# Patient Record
Sex: Male | Born: 2000 | Race: White | Hispanic: No | Marital: Single | Smoking: Current some day smoker
Health system: Southern US, Community
[De-identification: ages and names within clinical notes are randomized; demographics above are authoritative.]

---
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
## 2023-07-26 ENCOUNTER — Encounter: Payer: Self-pay | Admitting: Emergency Medicine

## 2023-07-26 ENCOUNTER — Other Ambulatory Visit: Payer: Self-pay

## 2023-07-26 ENCOUNTER — Emergency Department: Payer: Self-pay

## 2023-07-26 DIAGNOSIS — S92351A Displaced fracture of fifth metatarsal bone, right foot, initial encounter for closed fracture: Secondary | ICD-10-CM | POA: Insufficient documentation

## 2023-07-26 DIAGNOSIS — W19XXXA Unspecified fall, initial encounter: Secondary | ICD-10-CM | POA: Insufficient documentation

## 2023-07-26 DIAGNOSIS — M7989 Other specified soft tissue disorders: Secondary | ICD-10-CM | POA: Insufficient documentation

## 2023-07-26 DIAGNOSIS — Y9367 Activity, basketball: Secondary | ICD-10-CM | POA: Insufficient documentation

## 2023-07-26 NOTE — ED Triage Notes (Signed)
 Pt presents to the ED via POV with complaints of R foot pain that started yesterday after playing basketball. Notable swelling to the lateral side of his foot. Cap refill <3 seconds. A&Ox4 at this time. Denies CP or SOB.

## 2023-07-27 ENCOUNTER — Emergency Department
Admission: EM | Admit: 2023-07-27 | Discharge: 2023-07-27 | Disposition: A | Discharge status: Home / Self Care | Payer: Self-pay | Attending: Emergency Medicine | Admitting: Emergency Medicine

## 2023-07-27 DIAGNOSIS — S92351A Displaced fracture of fifth metatarsal bone, right foot, initial encounter for closed fracture: Secondary | ICD-10-CM

## 2023-07-27 MED ORDER — OXYCODONE-ACETAMINOPHEN 5-325 MG PO TABS
1.0000 | ORAL_TABLET | Freq: Once | ORAL | Status: AC
  Administered 2023-07-27: 1 via ORAL
  Filled 2023-07-27: qty 1

## 2023-07-27 MED ORDER — KETOROLAC TROMETHAMINE 60 MG/2ML IM SOLN
30.0000 mg | Freq: Once | INTRAMUSCULAR | Status: AC
  Administered 2023-07-27: 30 mg via INTRAMUSCULAR
  Filled 2023-07-27: qty 2

## 2023-07-27 MED ORDER — OXYCODONE-ACETAMINOPHEN 5-325 MG PO TABS: 1.0000 | ORAL_TABLET | ORAL | 0 refills | Status: AC | PRN

## 2023-07-27 NOTE — Discharge Instructions (Addendum)
 Take ibuprofen as needed for pain; Percocet as needed for more severe pain. Wear boot and use crutches to help you walk.  Do not bear weight on your right foot.  Return to the ER for worsening symptoms, numbness/tingling or other concerns.

## 2023-07-27 NOTE — ED Provider Notes (Signed)
 [facility] Provider Note    None    (approximate)   History   Foot Injury   HPI  Jeffery Crawford is a 23 y.o. male who presents to the ED from home with his mother with a chief complaint of right foot pain and injury incurred 2 days ago.  Patient was playing basketball, landed on the outside of his right foot.  Presents with pain and swelling.  Voices no other complaints or injuries.     Past Medical History  History reviewed. No pertinent past medical history.   Active Problem List  There are no active problems to display for this patient.    Past Surgical History  History reviewed. No pertinent surgical history.   Home Medications   Prior to Admission medications   Not on File     Allergies  Patient has no known allergies.   Family History  History reviewed. No pertinent family history.   Physical Exam  Triage Vital Signs: ED Triage Vitals  Encounter Vitals Group     BP 07/26/23 2305 117/81     Systolic BP Percentile --      Diastolic BP Percentile --      Pulse Rate 07/26/23 2305 82     Resp 07/26/23 2305 18     Temp 07/26/23 2305 98.6 F (37 C)     Temp Source 07/26/23 2305 Oral     SpO2 07/26/23 2305 100 %     Weight 07/26/23 2305 160 lb (72.6 kg)     Height 07/26/23 2305 5\' 10"  (1.778 m)     Head Circumference --      Peak Flow --      Pain Score 07/26/23 2306 5     Pain Loc --      Pain Education --      Exclude from Growth Chart --     Updated Vital Signs: BP 117/81 (BP Location: Right Arm)   Pulse 82   Temp 98.6 F (37 C) (Oral)   Resp 18   Ht 5\' 10"  (1.778 m)   Wt 72.6 kg   SpO2 100%   BMI 22.96 kg/m    General: Awake, no distress.  CV:  Good peripheral perfusion.  Resp:  Normal effort.  Abd:  No distention.  Other:  Right foot: Uniform swelling.  Small scab to dorsal right fifth digit.  Point tender base of right metatarsal.  Decreased range of motion secondary to pain.  2+ distal pulses.  Brisk,  less than 5-second capillary refill.   ED Results / Procedures / Treatments  Labs (all labs ordered are listed, but only abnormal results are displayed) Labs Reviewed - No data to display   EKG  None   RADIOLOGY I have independently visualized and interpreted patient's imaging study as well as noted the radiology interpretation:  Right foot/ankle: Comminuted intra-articular fracture at the base of the fifth metatarsal with 4 mm of distraction  Official radiology report(s): DG Foot Complete Right Result Date: 07/26/2023 CLINICAL DATA:  Right foot and ankle pain, bruising and swelling, basketball injury EXAM: RIGHT ANKLE - COMPLETE 3+ VIEW; RIGHT FOOT COMPLETE - 3+ VIEW COMPARISON:  None Available. FINDINGS: Right ankle: Frontal, oblique, lateral views are obtained. No fracture, subluxation, or dislocation. Joint spaces are well preserved. Anterior and lateral soft tissue swelling. Right foot: Frontal, oblique, and lateral views of the right foot are obtained. There is a comminuted transverse intra-articular fracture at the base of the fifth  metatarsal, with 4 mm of distraction of the fracture fragments. No other acute fracture. Joint spaces are well preserved. Marked soft tissue swelling throughout the forefoot and midfoot. IMPRESSION: 1. Comminuted intra-articular fracture at the base of the fifth metatarsal, with 4 mm of distraction at the fracture site. 2. Soft tissue swelling throughout the midfoot, forefoot, and ankle. Electronically Signed   By: Bobbye Burrow M.D.   On: 07/26/2023 23:36   DG Ankle Complete Right Result Date: 07/26/2023 CLINICAL DATA:  Right foot and ankle pain, bruising and swelling, basketball injury EXAM: RIGHT ANKLE - COMPLETE 3+ VIEW; RIGHT FOOT COMPLETE - 3+ VIEW COMPARISON:  None Available. FINDINGS: Right ankle: Frontal, oblique, lateral views are obtained. No fracture, subluxation, or dislocation. Joint spaces are well preserved. Anterior and lateral soft tissue  swelling. Right foot: Frontal, oblique, and lateral views of the right foot are obtained. There is a comminuted transverse intra-articular fracture at the base of the fifth metatarsal, with 4 mm of distraction of the fracture fragments. No other acute fracture. Joint spaces are well preserved. Marked soft tissue swelling throughout the forefoot and midfoot. IMPRESSION: 1. Comminuted intra-articular fracture at the base of the fifth metatarsal, with 4 mm of distraction at the fracture site. 2. Soft tissue swelling throughout the midfoot, forefoot, and ankle. Electronically Signed   By: Bobbye Burrow M.D.   On: 07/26/2023 23:36     PROCEDURES:  Critical Care performed: No  Procedures   MEDICATIONS ORDERED IN ED: Medications  ketorolac (TORADOL) injection 30 mg (has no administration in time range)  oxyCODONE-acetaminophen (PERCOCET/ROXICET) 5-325 MG per tablet 1 tablet (has no administration in time range)     IMPRESSION / MDM / ASSESSMENT AND PLAN / ED COURSE  I reviewed the triage vital signs and the nursing notes.                             23 year old male presenting with right foot injury.  Right metatarsal fracture noted on x-ray.  Administer IM Ketorolac, oral Percocet.  Placed in cam boot, crutches, instructed on nonweightbearing and patient will follow-up closely with podiatry.  Strict return precautions given.  Patient and mother verbalized understanding and agree with plan of care.  Patient's presentation is most consistent with acute, uncomplicated illness.    FINAL CLINICAL IMPRESSION(S) / ED DIAGNOSES   Final diagnoses:  Closed displaced fracture of fifth metatarsal bone of right foot, initial encounter     Rx / DC Orders   ED Discharge Orders     None        Note:  This document was prepared using Dragon voice recognition software and may include unintentional dictation errors.   Norlene Beavers, MD 07/27/23 [phone]

## 2023-07-27 NOTE — ED Notes (Addendum)
 Education provided to pt on how to use crutches and pt demonstrated successfully after.
